# Patient Record
Sex: Male | Born: 1990 | Race: White | Hispanic: No | Marital: Married
Health system: Southern US, Community
[De-identification: ages and names within clinical notes are randomized; demographics above are authoritative.]

---
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
## 2009-09-01 IMAGING — CR DG THORACOLUMBAR SPINE 2V
4 series · 4 of 4 positions shown · non-contrast
Comparison: None

CLINICAL DATA: Back pain

THORACOLUMBAR SPINE - 2 VIEW

[view not recorded (1 of 4)]
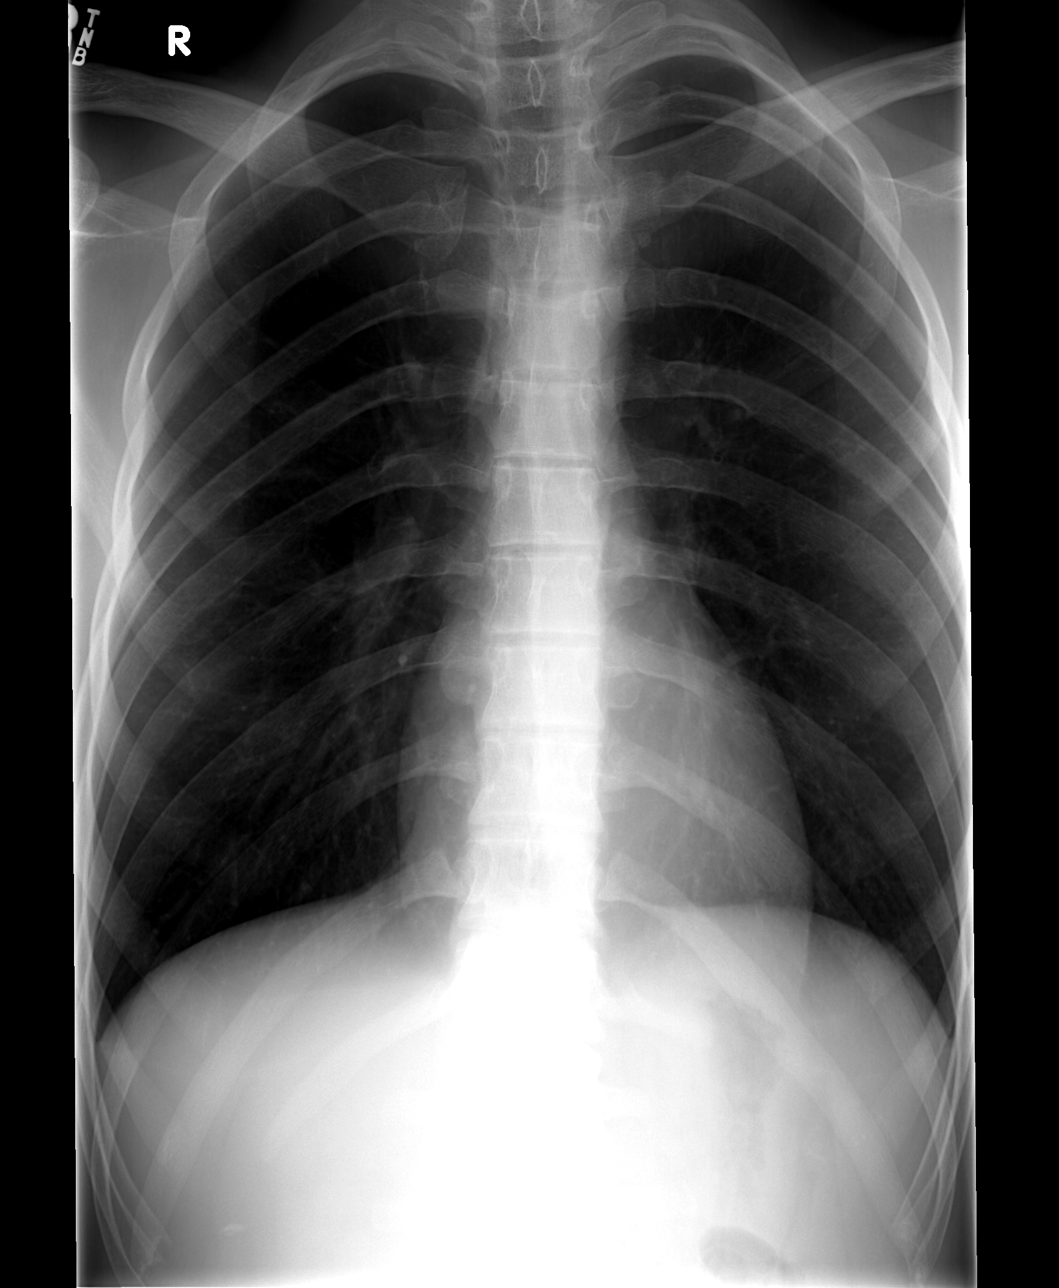

[view not recorded (2 of 4)]
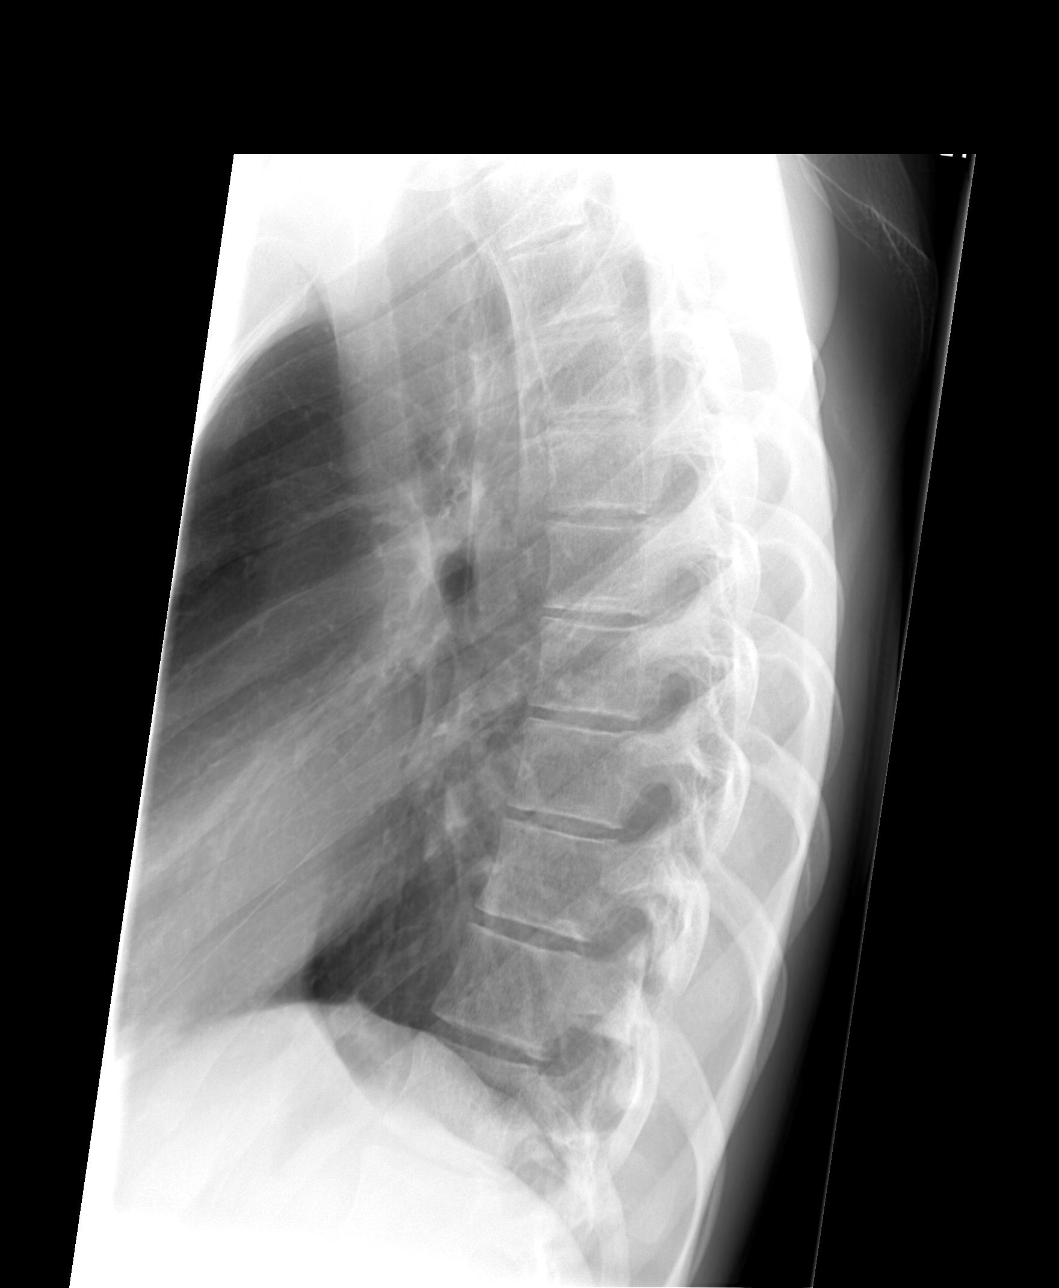

[view not recorded (3 of 4)]
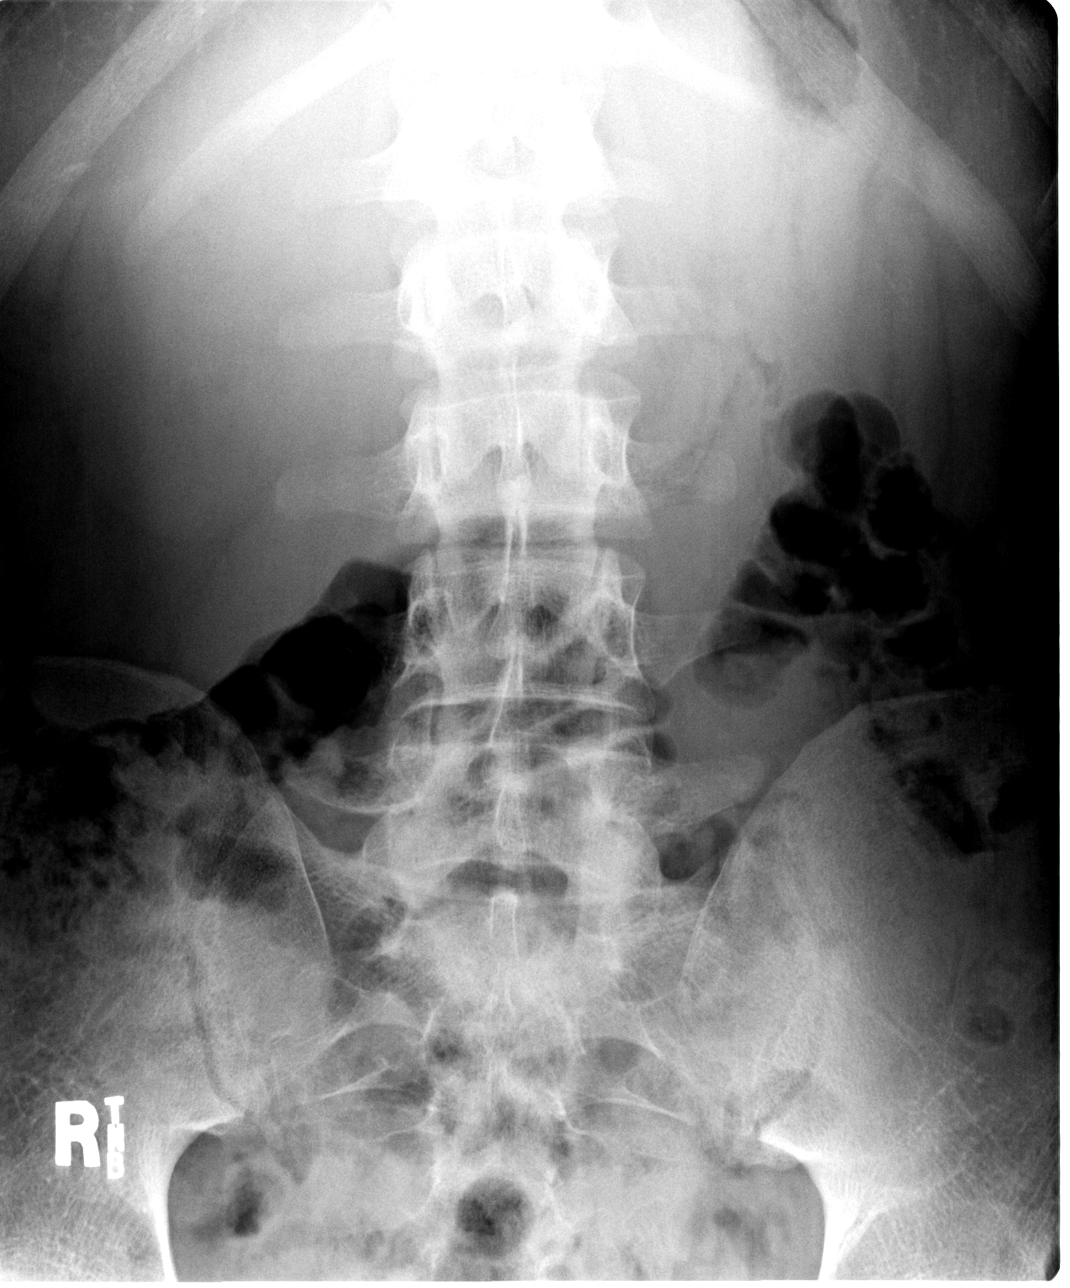

[view not recorded (4 of 4)]
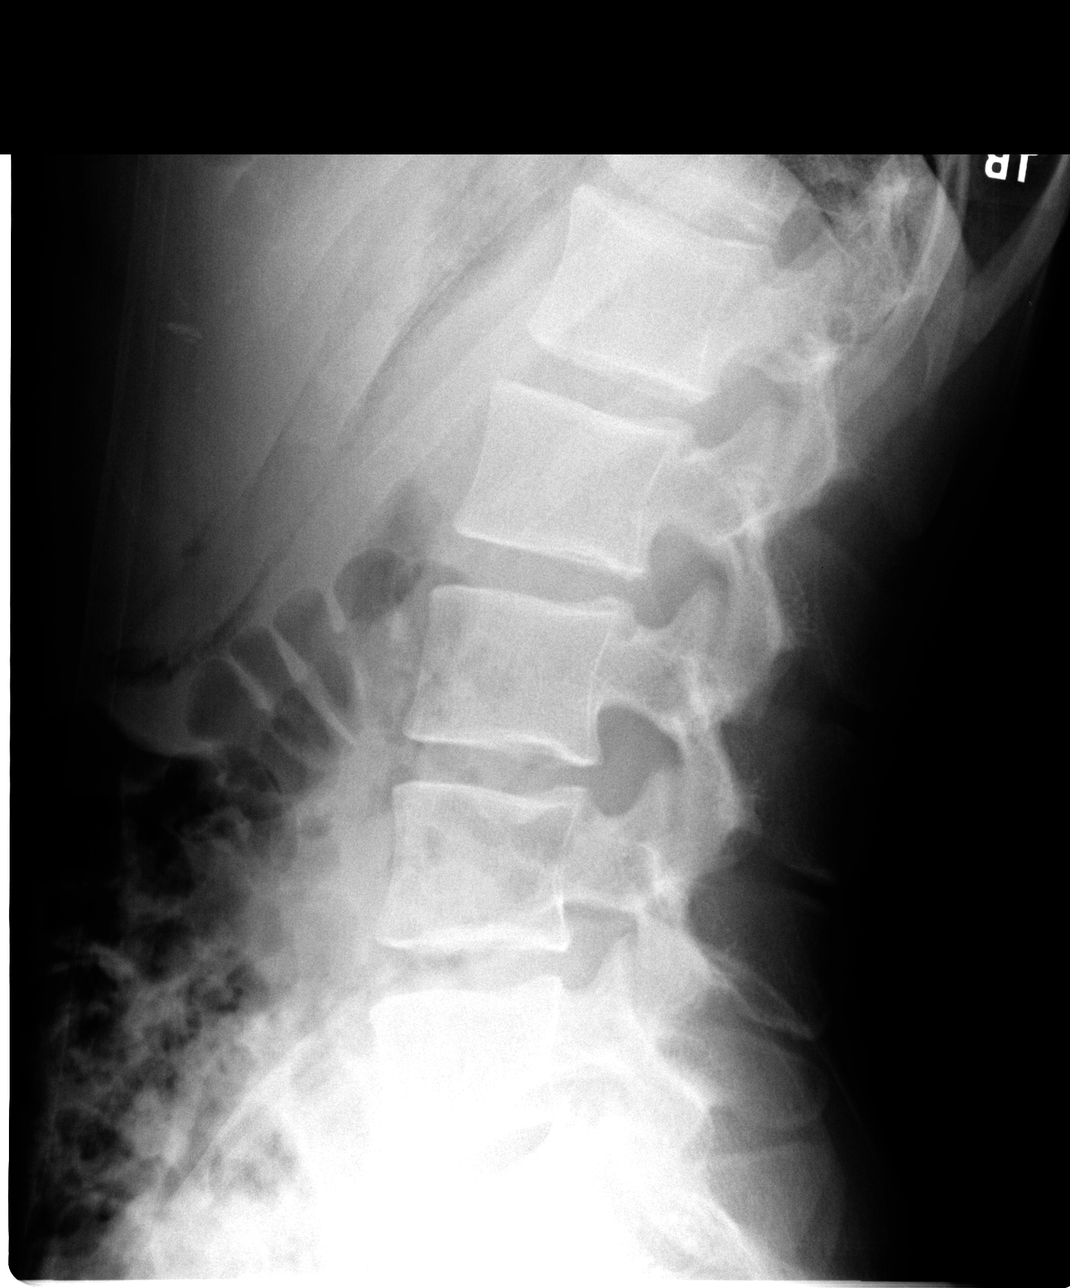

[4 of 4 positions shown; findings below may reference images not displayed]

FINDINGS: Normal thoracic lumbar spine.  Normal alignment.  No disc
space narrowing.  No pars defects or spondylolisthesis.  No lytic
or sclerotic bony lesions.
IMPRESSION: Negative thoracolumbar spine.

## 2019-11-19 ENCOUNTER — Other Ambulatory Visit: Payer: Self-pay

## 2019-11-19 DIAGNOSIS — Z20822 Contact with and (suspected) exposure to covid-19: Secondary | ICD-10-CM

## 2019-11-21 LAB — SARS-COV-2, NAA 2 DAY TAT

## 2019-11-21 LAB — NOVEL CORONAVIRUS, NAA: SARS-CoV-2, NAA: NOT DETECTED

## 2022-01-21 ENCOUNTER — Ambulatory Visit: Payer: Self-pay

## 2022-05-11 ENCOUNTER — Ambulatory Visit: Payer: BC Managed Care – PPO | Admitting: Urology

## 2022-05-11 VITALS — BP 120/75 | HR 67

## 2022-05-11 DIAGNOSIS — Z3009 Encounter for other general counseling and advice on contraception: Secondary | ICD-10-CM

## 2022-05-11 MED ORDER — DIAZEPAM 10 MG PO TABS: 10.0000 mg | ORAL_TABLET | Freq: Once | ORAL | 0 refills | Status: AC

## 2022-05-11 NOTE — Progress Notes (Signed)
   05/11/2022 3:43 PM   Marcus WINKELER 1990-01-18 [id number]  Referring provider: No referring provider defined for this encounter.  Desires sterilization   HPI: Mr Marcus Herman is a 32yo here for evaluation for vasectomy. 2 healthy children. No scrotal surgeries. No erectile dysfunction   PMH: No past medical history on file.  Surgical History:   Home Medications:  Allergies as of 05/11/2022   Not on File      Medication List    as of May 11, 2022  3:43 PM   You have not been prescribed any medications.     Allergies: Not on File  Family History: No family history on file.  Social History:  has no history on file for tobacco use, alcohol use, and drug use.  ROS: All other review of systems were reviewed and are negative except what is noted above in HPI  Physical Exam: BP 120/75   Pulse 67   Constitutional:  Alert and oriented, No acute distress. HEENT: Marcus Herman AT, moist mucus membranes.  Trachea midline, no masses. Cardiovascular: No clubbing, cyanosis, or edema. Respiratory: Normal respiratory effort, no increased work of breathing. GI: Abdomen is soft, nontender, nondistended, no abdominal masses GU: No CVA tenderness. Circumcised phallus. No masses/lesions on penis, testis, scrotum. Bilateral vas deferens palpable Lymph: No cervical or inguinal lymphadenopathy. Skin: No rashes, bruises or suspicious lesions. Neurologic: Grossly intact, no focal deficits, moving all 4 extremities. Psychiatric: Normal mood and affect.  Laboratory Data: No results found for: "WBC", "HGB", "HCT", "MCV", "PLT"  No results found for: "CREATININE"  No results found for: "PSA"  No results found for: "TESTOSTERONE"  No results found for: "HGBA1C"  Urinalysis No results found for: "COLORURINE", "APPEARANCEUR", "LABSPEC", "PHURINE", "GLUCOSEU", "HGBUR", "BILIRUBINUR", "KETONESUR", "PROTEINUR", "UROBILINOGEN", "NITRITE", "LEUKOCYTESUR"  No results found for: "LABMICR",  "WBCUA", "RBCUA", "LABEPIT", "MUCUS", "BACTERIA"  Pertinent Imaging:  No results found for this or any previous visit.  No results found for this or any previous visit.  No results found for this or any previous visit.  No results found for this or any previous visit.  No results found for this or any previous visit.  No valid procedures specified. No results found for this or any previous visit.  No results found for this or any previous visit.   Assessment & Plan:    1. Vasectomy evaluation -Schedule for vasectomy -RX for valium sent to pharmacy   No follow-ups on file.  Wilkie Aye, MD  [facility] [place]

## 2022-05-17 ENCOUNTER — Encounter: Payer: Self-pay | Admitting: Urology

## 2022-05-17 NOTE — Patient Instructions (Signed)
Vasectomy Vasectomy is a procedure in which the vas deferens is cut and then tied or burned (cauterized). The vas deferens is a tube that carries sperm from the testicle to the part of the body that drains urine from the bladder (urethra). This procedure blocks sperm from going through the vas deferens and penis during ejaculation. This ensures that sperm does not go into the vagina during sex. Vasectomy does not affect sexual desire or performance and does not prevent sexually transmitted infections. Vasectomy is considered a permanent and very effective form of birth control (contraception). The decision to have a vasectomy should not be made during a stressful time, such as after the loss of a pregnancy or a divorce. You and your partner should decide on whether to have a vasectomy when you are sure that you do not want children in the future. Tell a health care provider about: Any allergies you have. All medicines you are taking, including vitamins, herbs, eye drops, creams, and over-the-counter medicines. Any problems you or family members have had with anesthetic medicines. Any blood disorders you have. Any surgeries you have had. Any medical conditions you have. What are the risks? Generally, this is a safe procedure. However, problems may occur, including: Infection. Bleeding and swelling of the scrotum. The scrotum is the sac that contains the testicles, blood vessels, and structures that help deliver sperm and semen. Allergic reactions to medicines. Failure of the procedure to prevent pregnancy. There is a very small chance that the tied or cauterized ends of the vas deferens may reconnect (recanalization). If this happens, you could still make a woman pregnant. Pain in the scrotum that continues after you heal from the procedure. What happens before the procedure? Medicines Ask your health care provider about: Changing or stopping your regular medicines. This is especially important if  you are taking diabetes medicines or blood thinners. Taking medicines such as aspirin and ibuprofen. These medicines can thin your blood. Do not take these medicines unless your health care provider tells you to take them. Taking over-the-counter medicines, vitamins, herbs, and supplements. You may be told to take a medicine to help you relax (sedative) a few hours before the procedure. General instructions Do not use any products that contain nicotine or tobacco for at least 4 weeks before the procedure. These products include cigarettes, e-cigarettes, and chewing tobacco. If you need help quitting, ask your health care provider. Plan to have a responsible adult take you home from the hospital or clinic. If you will be going home right after the procedure, plan to have a responsible adult care for you for the time you are told. This is important. Ask your health care provider: How your surgery site will be marked. What steps will be taken to help prevent infection. These steps may include: Removing hair at the surgery site. Washing skin with a germ-killing soap. Taking antibiotic medicine. What happens during the procedure?  You will be given one or more of the following: A sedative, unless you were told to take this a few hours before the procedure. A medicine to numb the area (local anesthetic). Your health care provider will feel, or palpate, for your vas deferens. To reach the vas deferens, one of two methods may be used: A very small incision may be made in your scrotum. A punctured opening may be made in your scrotum, without an incision. Your vas deferens will be pulled out of your scrotum and cut. Then, the vas deferens will be closed   in one of two ways: Tied at the ends. Cauterized at the ends to seal them off. The vas deferens will be put back into your scrotum. The incision or puncture opening will be closed with absorbable stitches (sutures). The sutures will eventually  dissolve and will not need to be removed after the procedure. The procedure will be repeated on the other side of your scrotum. The procedure may vary among health care providers and hospitals. What happens after the procedure? You will be monitored to make sure that you do not have problems. You will be asked not to ejaculate for at least 1 week after the procedure, or for as long as you are told. You will need to use a different form of contraception for 2-4 months after the procedure, until you have test results confirming that there are no sperm in your semen. You may be given scrotal support to wear, such as a jockstrap or underwear with a supportive pouch. If you were given a sedative during the procedure, it can affect you for several hours. Do not drive or operate machinery until your health care provider says that it is safe. Summary Vasectomy blocks sperm from being released during ejaculation. This procedure is considered a permanent and very effective form of birth control. Your scrotum will be numbed with medicine (local anesthetic) for the procedure. After the procedure, you will be asked not to ejaculate for at least 1 week, or for as long as you are told. You will also need to use a different form of contraception until your test results confirm that there are no sperm in your semen. This information is not intended to replace advice given to you by your health care provider. Make sure you discuss any questions you have with your health care provider. Document Revised: 05/09/2019 Document Reviewed: 05/09/2019 Elsevier Patient Education  2023 [employer]  

## 2022-08-23 ENCOUNTER — Telehealth: Payer: Self-pay

## 2022-08-23 NOTE — Telephone Encounter (Signed)
Patient never picked up his valuim for his vasectomy on Friday.  Can you resend this to his pharmacy.

## 2022-08-25 ENCOUNTER — Other Ambulatory Visit: Payer: Self-pay | Admitting: Urology

## 2022-08-25 MED ORDER — DIAZEPAM 10 MG PO TABS: 10.0000 mg | ORAL_TABLET | Freq: Once | ORAL | 0 refills | Status: AC

## 2022-08-25 NOTE — Telephone Encounter (Signed)
Patient needing valuim for tomorrow's vasectomy.

## 2022-08-26 ENCOUNTER — Ambulatory Visit (INDEPENDENT_AMBULATORY_CARE_PROVIDER_SITE_OTHER): Payer: BC Managed Care – PPO | Admitting: Urology

## 2022-08-26 ENCOUNTER — Encounter: Payer: Self-pay | Admitting: Urology

## 2022-08-26 VITALS — BP 128/89 | HR 86

## 2022-08-26 DIAGNOSIS — Z9852 Vasectomy status: Secondary | ICD-10-CM

## 2022-08-26 DIAGNOSIS — Z302 Encounter for sterilization: Secondary | ICD-10-CM | POA: Diagnosis not present

## 2022-08-26 NOTE — Patient Instructions (Signed)
Vasectomy Postoperative Instructions ? ?Please bring back a semen analysis in approximately 3 months.  ?Your semen analysis  will need to be taken to  ?[facility]  [address], [place], [place] [address] ?[phone] ? ? You will be given a sterile specimen cup. Please label the cup with your name, date of birth, date and time of collections.  ?What to Expect ? - slight redness, swelling and scant drainage along the incision ? - mild to moderate discomfort ? - black and blue (bruising) as the tissue heals ? - low grade fever ? - scrotal sensitivity and/or tenderness ?- Edges of the incision may pull apart and heal slowly, sometimes a knot may be present which remains for several months.  This is NORMAL and all part of the healing process. ?- if stitches are placed, they do not need to be removed ?- if you have pain or discomfort immediately after the vasectomy, you may use OTC pain medication for relief , ex: tylenol.  After local anesthetic wears off an ice pack will provide additional comfort and can also prevent swelling if used ? ?Activity ? - no sexual intercourse for at lease 5 days depending on comfort ? - no heavy lifting for 48-72 hours (anything over 5-10 lbs) ? ?Wound Care ? - shower only after 24 hours ? - no tub baths, hot tub, or pools for at least 7 days ? - ice packs for 48 hours: 30 minutes on and 30 minutes off ? ?Problem to Report ? - generalized redness ? - increased pain and swelling ? - fever greater than 101 F ? - significant drainage or bleeding from the wound ? ?TO DO ?- Ejaculations help to clear the passage of sperm, but you must use another from of birth control until you are told you may discontinue its use!! ?- You will be given a specimen cup to bring back a semen sample in 3 months to check and see if its clear of sperm.  Only after the semen is sent for analysis and is reported back as clear should you use this as your primary form of birth control!    ?

## 2022-08-26 NOTE — Progress Notes (Signed)
08/26/22  CC: desires sterilization   HPI: Marcus Herman is a 32yo here for vasectomy Blood pressure 128/89, pulse 86. NED. A&Ox3.   No respiratory distress   Abd soft, NT, ND Normal external genitalia with patent urethral meatus  A timeout was performed.  Patient's identity and consent was confirmed.  All questions were answered.   Bilateral Vasectomy Procedure  Pre-Procedure: - Patient's scrotum was prepped and draped for vasectomy. - The vas was palpated through the scrotal skin on the left. - 1% Xylocaine was injected into the skin and surrounding tissue for placement  - In a similar manner, the vas on the right was identified, anesthetized, and stabilized.  Procedure: - A sharp hemostat was used to make a small stab incision in the skin overlying the vas - The left vas was isolated and brought up through the incision exposing that structure. - Bleeding points were cauterized as they occurred. - The vas was free from the surrounding structures and brought to the view. - A segment was positioned for placement with a hemostat. - A second hemostat was placed and a small segment between the two hemostats and was removed for inspection. - Each end of the transected vas lumen was fulgurated/ obliterated using needlepoint electrocautery -A fascial interposition was performed on testicular end of the vas using #3-0 chromic suture -The same procedure was performed on the right. - A single suture of #3-0 chromic catgut was used to close each lateral scrotal skin incision - A dressing was applied.  Post-Procedure: - Patient was instructed in care of the operative area - A specimen is to be delivered in 12 weeks   -Another form of contraception is to be used until post vasectomy semen analysis  Wilkie Aye, MD

## 2022-12-10 LAB — POST-VAS SPERM EVALUATION,QUAL: Volume: 2.7 mL

## 2022-12-13 ENCOUNTER — Telehealth: Payer: Self-pay | Admitting: Urology

## 2022-12-13 ENCOUNTER — Other Ambulatory Visit: Payer: Self-pay

## 2022-12-13 DIAGNOSIS — Z9852 Vasectomy status: Secondary | ICD-10-CM

## 2022-12-13 NOTE — Telephone Encounter (Signed)
Pt received specimen results that said abnormal, he would like someone to call him about the results.

## 2022-12-13 NOTE — Telephone Encounter (Signed)
Tried call patient with no answer, left vm for return call to office.

## 2023-01-22 LAB — TEST CODE CHANGE

## 2023-01-22 LAB — POST-VAS SPERM EVALUATION,QUAL: Volume: 2.1 mL

## 2023-01-30 ENCOUNTER — Other Ambulatory Visit: Payer: Self-pay

## 2023-01-30 ENCOUNTER — Telehealth: Payer: Self-pay | Admitting: Urology

## 2023-01-30 DIAGNOSIS — Z9852 Vasectomy status: Secondary | ICD-10-CM

## 2023-01-30 NOTE — Telephone Encounter (Signed)
Patient called and he has had 2 abnormal semen results since having his Vasectomy and he would like to know what the next step is going to be? Please call

## 2023-01-30 NOTE — Telephone Encounter (Signed)
Patient called and made aware of [facility] semen analysis comment stating specimen was not received within 1 hr.  Patient requested to come by and pick up another lab order.  Lab order and specimen cups placed up front for pick up.

## 2023-02-02 LAB — POST-VAS SPERM EVALUATION,QUAL: Volume: 1.9 mL

## 2023-02-02 LAB — TEST CODE CHANGE
